# Patient Record
Sex: Female | Born: 1955 | Race: Black or African American | Hispanic: No | Marital: Married | Smoking: Former smoker
Health system: Southern US, Community
[De-identification: ages and names within clinical notes are randomized; demographics above are authoritative.]

## PROBLEM LIST (undated history)

## (undated) DIAGNOSIS — G43909 Migraine, unspecified, not intractable, without status migrainosus: Secondary | ICD-10-CM

## (undated) DIAGNOSIS — B019 Varicella without complication: Secondary | ICD-10-CM

## (undated) DIAGNOSIS — R519 Headache, unspecified: Secondary | ICD-10-CM

## (undated) DIAGNOSIS — R51 Headache: Secondary | ICD-10-CM

## (undated) DIAGNOSIS — N390 Urinary tract infection, site not specified: Secondary | ICD-10-CM

## (undated) HISTORY — DX: Varicella without complication: B01.9

## (undated) HISTORY — DX: Urinary tract infection, site not specified: N39.0

## (undated) HISTORY — DX: Migraine, unspecified, not intractable, without status migrainosus: G43.909

## (undated) HISTORY — DX: Headache, unspecified: R51.9

## (undated) HISTORY — PX: BREAST BIOPSY: SHX20

## (undated) HISTORY — DX: Headache: R51

---
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
## 2014-09-03 ENCOUNTER — Encounter (INDEPENDENT_AMBULATORY_CARE_PROVIDER_SITE_OTHER): Payer: Self-pay

## 2014-09-03 ENCOUNTER — Encounter: Payer: Self-pay | Admitting: Primary Care

## 2014-09-03 ENCOUNTER — Ambulatory Visit (INDEPENDENT_AMBULATORY_CARE_PROVIDER_SITE_OTHER): Payer: Managed Care, Other (non HMO) | Admitting: Primary Care

## 2014-09-03 VITALS — BP 122/86 | HR 71 | Temp 98.0°F | Ht 61.75 in | Wt 236.1 lb

## 2014-09-03 DIAGNOSIS — R079 Chest pain, unspecified: Secondary | ICD-10-CM

## 2014-09-03 NOTE — Progress Notes (Signed)
Pre visit review using our clinic review tool, if applicable. No additional management support is needed unless otherwise documented below in the visit note. 

## 2014-09-03 NOTE — Patient Instructions (Addendum)
Please schedule a physical with me in the next month. You will also schedule a lab only appointment one week prior. We will discuss your lab results during your physical.  Your ECG (heart test) did not show any abnormalities.   You may try taking ibuprofen 400-600 mg three times daily for chest wall pain. You should rest when possible to prevent any further muscle irritation.   It was a pleasure to meet you today! Please don't hesitate to call me with any questions. Welcome to [facility]!

## 2014-09-03 NOTE — Progress Notes (Signed)
   Subjective:    Patient ID: Sandra Ayala, female    DOB: 1955/05/18, 59 y.o.   MRN: [id number]  HPI  Sandra Ayala is a 59 year old female who presents today to establish care and discuss the problems mentioned below. Will obtain old records. She has not had a routine physical in over 20+.  1) Chest wall pain: Present to anterior chest and left side of breast for the past 2 months. Intermittent. She describes this pain as muscle tightness and her pain will move around. Chest will be tender upon palpation. 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   2) Frequent headaches/Migraines: Will get headaches twice weekly. She denies sensitivity to light and sound when getting severe headaches. Will take advil with relief.  Review of Systems  Constitutional: Negative for unexpected weight change.  HENT: Negative for rhinorrhea.   Respiratory: Negative for cough and shortness of breath.   Cardiovascular: Negative for chest pain.  Gastrointestinal: Negative for diarrhea and constipation.  Genitourinary: Negative for difficulty urinating.       No period in three years  Musculoskeletal: Negative for myalgias and arthralgias.  Skin: Negative for rash.  Neurological: Negative for dizziness and numbness.       Occasional headaches       Objective:   Physical Exam  Constitutional: She is oriented to person, place, and time. She appears well-nourished.  Cardiovascular: Normal rate and regular rhythm.     Chest wall tenderness with palpation.  Pulmonary/Chest: Effort normal and breath sounds normal.  Neurological: She is alert and oriented to person, place, and time.  Skin: Skin is warm and dry.  Psychiatric: She has a normal mood and affect.          Assessment & Plan:  Chest wall pain:  Pain upon palpation to anterior left chest during exam. Present for past 2 months. Denies nausea, diaphoresis, radiation. Describes pain as "tightnes". Denies belching, reflux of  gastric contents, epigastric pain. Suspect MSK related as her occupation requires lifting daily. ECG today reveals NSR with a rate of 66. No ST-elevation, PVC, PAC, t-wave inversion. Ibuprofen PRN, ice, rest. Follow up if no relief.

## 2014-09-04 ENCOUNTER — Other Ambulatory Visit: Payer: Self-pay | Admitting: Primary Care

## 2014-09-04 DIAGNOSIS — Z1231 Encounter for screening mammogram for malignant neoplasm of breast: Secondary | ICD-10-CM

## 2014-09-05 ENCOUNTER — Ambulatory Visit
Admission: RE | Admit: 2014-09-05 | Discharge: 2014-09-05 | Disposition: A | Discharge status: Home / Self Care | Payer: Managed Care, Other (non HMO) | Source: Ambulatory Visit | Attending: Primary Care | Admitting: Primary Care

## 2014-09-05 DIAGNOSIS — Z1231 Encounter for screening mammogram for malignant neoplasm of breast: Secondary | ICD-10-CM | POA: Diagnosis not present

## 2014-09-26 ENCOUNTER — Other Ambulatory Visit: Payer: Self-pay | Admitting: Primary Care

## 2014-09-26 DIAGNOSIS — Z0000 Encounter for general adult medical examination without abnormal findings: Secondary | ICD-10-CM

## 2014-10-02 ENCOUNTER — Other Ambulatory Visit (INDEPENDENT_AMBULATORY_CARE_PROVIDER_SITE_OTHER): Payer: Managed Care, Other (non HMO)

## 2014-10-02 DIAGNOSIS — Z0000 Encounter for general adult medical examination without abnormal findings: Secondary | ICD-10-CM | POA: Diagnosis not present

## 2014-10-02 LAB — LIPID PANEL
CHOLESTEROL: 251 mg/dL — AB (ref 0–200)
HDL: 69.9 mg/dL (ref >39.00)
LDL Cholesterol: 164 mg/dL — ABNORMAL HIGH (ref 0–99)
NONHDL: 181.26
Total CHOL/HDL Ratio: 4
Triglycerides: 88 mg/dL (ref 0.0–149.0)
VLDL: 17.6 mg/dL (ref 0.0–40.0)

## 2014-10-02 LAB — COMPREHENSIVE METABOLIC PANEL
ALT: 14 U/L (ref 0–35)
AST: 15 U/L (ref 0–37)
Albumin: 4.6 g/dL (ref 3.5–5.2)
Alkaline Phosphatase: 60 U/L (ref 39–117)
BILIRUBIN TOTAL: 0.7 mg/dL (ref 0.2–1.2)
BUN: 10 mg/dL (ref 6–23)
CALCIUM: 9.8 mg/dL (ref 8.4–10.5)
CHLORIDE: 100 meq/L (ref 96–112)
CO2: 29 meq/L (ref 19–32)
Creatinine, Ser: 0.65 mg/dL (ref 0.40–1.20)
GFR: 120.53 mL/min (ref >60.00)
GLUCOSE: 94 mg/dL (ref 70–99)
Potassium: 4.3 mEq/L (ref 3.5–5.1)
Sodium: 137 mEq/L (ref 135–145)
Total Protein: 7.4 g/dL (ref 6.0–8.3)

## 2014-10-02 LAB — TSH: TSH: 2.23 u[IU]/mL (ref 0.35–4.50)

## 2014-10-02 LAB — HEMOGLOBIN A1C: HEMOGLOBIN A1C: 5.7 % (ref 4.6–6.5)

## 2014-10-02 LAB — CBC
HEMATOCRIT: 37.8 % (ref 36.0–46.0)
Hemoglobin: 12.7 g/dL (ref 12.0–15.0)
MCHC: 33.7 g/dL (ref 30.0–36.0)
MCV: 89.8 fl (ref 78.0–100.0)
PLATELETS: 279 10*3/uL (ref 150.0–400.0)
RBC: 4.21 Mil/uL (ref 3.87–5.11)
RDW: 13.5 % (ref 11.5–15.5)
WBC: 4.6 10*3/uL (ref 4.0–10.5)

## 2014-10-02 LAB — VITAMIN D 25 HYDROXY (VIT D DEFICIENCY, FRACTURES): VITD: 14.52 ng/mL — AB (ref 30.00–100.00)

## 2014-10-09 ENCOUNTER — Ambulatory Visit (INDEPENDENT_AMBULATORY_CARE_PROVIDER_SITE_OTHER): Payer: Managed Care, Other (non HMO) | Admitting: Primary Care

## 2014-10-09 ENCOUNTER — Encounter: Payer: Self-pay | Admitting: Primary Care

## 2014-10-09 ENCOUNTER — Other Ambulatory Visit (HOSPITAL_COMMUNITY)
Admission: RE | Admit: 2014-10-09 | Discharge: 2014-10-09 | Disposition: A | Discharge status: Home / Self Care | Payer: Managed Care, Other (non HMO) | Source: Ambulatory Visit | Attending: Primary Care | Admitting: Primary Care

## 2014-10-09 VITALS — BP 120/86 | HR 68 | Temp 98.5°F | Ht 62.0 in | Wt 239.4 lb

## 2014-10-09 DIAGNOSIS — Z23 Encounter for immunization: Secondary | ICD-10-CM | POA: Diagnosis not present

## 2014-10-09 DIAGNOSIS — Z124 Encounter for screening for malignant neoplasm of cervix: Secondary | ICD-10-CM | POA: Diagnosis not present

## 2014-10-09 DIAGNOSIS — Z01419 Encounter for gynecological examination (general) (routine) without abnormal findings: Secondary | ICD-10-CM | POA: Diagnosis present

## 2014-10-09 DIAGNOSIS — E559 Vitamin D deficiency, unspecified: Secondary | ICD-10-CM | POA: Diagnosis not present

## 2014-10-09 DIAGNOSIS — Z1151 Encounter for screening for human papillomavirus (HPV): Secondary | ICD-10-CM | POA: Insufficient documentation

## 2014-10-09 DIAGNOSIS — Z30432 Encounter for removal of intrauterine contraceptive device: Secondary | ICD-10-CM

## 2014-10-09 DIAGNOSIS — Z0000 Encounter for general adult medical examination without abnormal findings: Secondary | ICD-10-CM | POA: Insufficient documentation

## 2014-10-09 DIAGNOSIS — E785 Hyperlipidemia, unspecified: Secondary | ICD-10-CM | POA: Insufficient documentation

## 2014-10-09 DIAGNOSIS — Z1211 Encounter for screening for malignant neoplasm of colon: Secondary | ICD-10-CM

## 2014-10-09 MED ORDER — VITAMIN D (ERGOCALCIFEROL) 1.25 MG (50000 UNIT) PO CAPS: ORAL_CAPSULE | ORAL | Status: DC

## 2014-10-09 NOTE — Assessment & Plan Note (Signed)
Tdap and flu administered today. Pap preformed today, referral for colonoscopy made. Recommended annual eye and dental exams. Discussed the importance of healthy diet and exercise. She is to increase water consumption. Labs with hyperlipidemia and low vitamin D levels. Exam unremarkable. Follow up in 1 year for repeat physical.

## 2014-10-09 NOTE — Patient Instructions (Signed)
You will be contacted regarding your referral to GI for your colonoscopy.  Please let us know if you have not heard back within one week.   Start Vitamin D capsules. Take 1 capsule by mouth once weekly for 12 weeks.  Start drinking more water. You need 2 liters of water daily.  Start exercising. You need 1 hour of moderate intensity exercise 5 days a week.  Work to increase consumption of vegetables and lean meats.  Schedule a lab only appointment in 3 months for re-evaluation of cholesterol and vitamin D levels.  Follow up in 1 year for repeat physical.  It was a pleasure to see you today!

## 2014-10-09 NOTE — Progress Notes (Signed)
Pre visit review using our clinic review tool, if applicable. No additional management support is needed unless otherwise documented below in the visit note. 

## 2014-10-09 NOTE — Assessment & Plan Note (Signed)
Vitamin D level of 18 on labs. Start vitamin D 50,000 units once weekly for 12 weeks. Recheck in 12 weeks.

## 2014-10-09 NOTE — Progress Notes (Signed)
Subjective:    Patient ID: Sandra Ayala, female    DOB: Nov 14, 1955, 59 y.o.   MRN: [id number]  HPI  Ms. Sandra Ayala is a 59 year old female who presents today for complete physical.  Immunizations: -Tetanus: Unsure, believes it's been over 10 years. -Influenza: Has not completed. Will provide today.  Diet: Endorses poor diet. Her diet consists of: Breakfast: Protein bar and apple. Lunch: Orange and salad Dinner: Skips, salad, casseroles, mashed potatoes. Desserts: Ice cream. Twice weekly. Snack: Chips Beverages: Water (1 glass of water daily), coffee.  Exercise: She does not exercise. Eye exam: Completed years ago. Denies changes in vision. Dental exam: Completed 4 years ago. Colonoscopy: Has never completed. Will set up today. Pap Smear: Completed 7 years ago. Will preform today. Mammogram: Completed in August 2016   Review of Systems  HENT: Negative for rhinorrhea.   Respiratory: Negative for cough and shortness of breath.   Cardiovascular: Negative for chest pain.  Gastrointestinal: Negative for diarrhea and constipation.  Genitourinary: Negative for difficulty urinating.  Musculoskeletal: Negative for myalgias and arthralgias.  Skin: Negative for rash.  Neurological: Negative for dizziness, numbness and headaches.  Psychiatric/Behavioral:       Denies concerns for anxiety or depression       Past Medical History  Diagnosis Date  . Chicken pox   . Frequent headaches   . Migraine   . UTI (lower urinary tract infection)     Social History   Social History  . Marital Status: Married    Spouse Name: N/A  . Number of Children: N/A  . Years of Education: N/A   Occupational History  . Not on file.   Social History Main Topics  . Smoking status: Never Smoker   . Smokeless tobacco: Not on file  . Alcohol Use: 0.0 oz/week    0 Standard drinks or equivalent per week     Comment: socially  . Drug Use: Not on file  . Sexual Activity: Not on file   Other  Topics Concern  . Not on file   Social History Narrative   Married.   4 children.   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   Enjoys reading.       Past Surgical History  Procedure Laterality Date  . Breast surgery  1973  . Breast biopsy      pt states a small lump was removed when she was 59 years old, unsure of which side    Family History  Problem Relation Age of Onset  . Cancer Mother 3    breast  . Breast cancer Mother 74  . Cancer Sister 43    breast, deceased  . Parkinson's disease Father     deceased    No Known Allergies  No current outpatient prescriptions on file prior to visit.   No current facility-administered medications on file prior to visit.    BP 120/86 mmHg  Pulse 68  Temp(Src) 98.5 F (36.9 C) (Oral)  Ht 5\' 2"  (1.575 m)  Wt 239 lb 6.4 oz (108.591 kg)  BMI 43.78 kg/m2  SpO2 98%    Objective:   Physical Exam  Constitutional: She is oriented to person, place, and time. She appears well-nourished.  HENT:  Right Ear: Tympanic membrane and ear canal normal.  Left Ear: Tympanic membrane and ear canal normal.  Nose: Nose normal.  Mouth/Throat: Oropharynx is clear and moist.  Eyes: Conjunctivae and EOM are normal. Pupils are equal, round, and reactive to light.  Neck: Neck supple. No thyromegaly present.  Cardiovascular: Normal rate and regular rhythm.   Pulmonary/Chest: Effort normal and breath sounds normal.  Abdominal: Soft. Bowel sounds are normal. There is no tenderness.  Genitourinary: Vagina normal. Cervix exhibits no motion tenderness and no discharge. Right adnexum displays no tenderness. Left adnexum displays no tenderness.  Musculoskeletal: Normal range of motion.  Lymphadenopathy:    She has no cervical adenopathy.  Neurological: She is alert and oriented to person, place, and time. She has normal reflexes. No cranial nerve deficit.  Skin: Skin is warm and dry.  Psychiatric: She has a normal mood and affect.            Assessment & Plan:

## 2014-10-09 NOTE — Assessment & Plan Note (Signed)
TC and LDL above goal. Discussed importance of healthy diet and exercise. She will work hard to improve her lifestyle before introduction of medication. Recheck lipids and vitamin D in 3 months.

## 2014-10-10 LAB — CYTOLOGY - PAP

## 2014-10-14 ENCOUNTER — Encounter: Payer: Self-pay | Admitting: *Deleted

## 2014-10-16 ENCOUNTER — Telehealth: Payer: Self-pay | Admitting: Primary Care

## 2014-10-16 NOTE — Telephone Encounter (Signed)
Pt returned your call, please call back at [phone] thanks

## 2014-10-17 NOTE — Telephone Encounter (Signed)
Did not contact patient. Patient has referral to GYN.

## 2014-11-04 ENCOUNTER — Encounter: Payer: Self-pay | Admitting: Obstetrics & Gynecology

## 2014-11-04 ENCOUNTER — Ambulatory Visit (INDEPENDENT_AMBULATORY_CARE_PROVIDER_SITE_OTHER): Payer: Managed Care, Other (non HMO) | Admitting: Obstetrics & Gynecology

## 2014-11-04 VITALS — BP 133/65 | HR 93 | Resp 20 | Ht 62.0 in | Wt 238.0 lb

## 2014-11-04 DIAGNOSIS — Z538 Procedure and treatment not carried out for other reasons: Secondary | ICD-10-CM

## 2014-11-04 DIAGNOSIS — Z30432 Encounter for removal of intrauterine contraceptive device: Secondary | ICD-10-CM

## 2014-11-04 DIAGNOSIS — T8389XA Other specified complication of genitourinary prosthetic devices, implants and grafts, initial encounter: Secondary | ICD-10-CM

## 2014-11-04 DIAGNOSIS — Z975 Presence of (intrauterine) contraceptive device: Principal | ICD-10-CM

## 2014-11-04 NOTE — Progress Notes (Signed)
    [place] PROCEDURE NOTE  Sandra Ayala is a 59 y.o. G4P0 here for IUD removal.  Referred from PCP.  Had IUD placed seven years ago from contraception, unsure of what type.  No GYN concerns.  Last pap smear was on 10/09/2014 and was normal with negative HRHPV.  Normal mammogram on 09/05/2014.  IUD Removal Attempt Patient identified, informed consent performed, consent signed.  Patient was in the dorsal lithotomy position, normal external genitalia was noted.  A speculum was placed in the patient's vagina, normal discharge was noted, no lesions. The cervix was visualized, no lesions, no abnormal discharge.  The strings of the IUD were not visualized, so betadine was used to swab there cervix, and Kelly forceps were introduced into the cervical canal. Unable to grasp any strings or palpate IUD so procedure was aborted.  There was minimal bleeding.  Patient tolerated the procedure attempt well.    Bedside clinic scan did not reveal any IUD in the uterus but this was a limited scan.  Formal pelvic ultrasound was ordered for further evaluation; will follow up results and manage accordingly.  Routine preventative health maintenance measures emphasized.   Verita Schneiders, MD, [place] Attending Obstetrician & Gynecologist, [place] for [facility]

## 2014-11-04 NOTE — Patient Instructions (Signed)
Return to clinic for any scheduled appointments or for any gynecologic concerns as needed.   

## 2014-11-07 ENCOUNTER — Ambulatory Visit
Admission: RE | Admit: 2014-11-07 | Discharge: 2014-11-07 | Disposition: A | Discharge status: Home / Self Care | Payer: Managed Care, Other (non HMO) | Source: Ambulatory Visit | Attending: Obstetrics & Gynecology | Admitting: Obstetrics & Gynecology

## 2014-11-07 DIAGNOSIS — Z30431 Encounter for routine checking of intrauterine contraceptive device: Secondary | ICD-10-CM | POA: Diagnosis not present

## 2014-11-07 DIAGNOSIS — Z975 Presence of (intrauterine) contraceptive device: Secondary | ICD-10-CM

## 2014-11-07 DIAGNOSIS — Z538 Procedure and treatment not carried out for other reasons: Secondary | ICD-10-CM

## 2014-11-12 ENCOUNTER — Telehealth: Payer: Self-pay | Admitting: Primary Care

## 2014-11-12 DIAGNOSIS — E559 Vitamin D deficiency, unspecified: Secondary | ICD-10-CM

## 2014-11-12 NOTE — Telephone Encounter (Signed)
Received fax for [facility] for vitamin D 50,000 units. I prescribed this in late September.  1. Has she picked up a script from [place]? 2. Has she started taking it? 3. Can she not continue to get the refills at [facility]? Is it a cheaper/better option through [facility]?  Thanks.

## 2014-11-13 ENCOUNTER — Telehealth: Payer: Self-pay | Admitting: Primary Care

## 2014-11-13 NOTE — Telephone Encounter (Signed)
Pt states that she received a call - please call back at [phone] Thank you

## 2014-11-13 NOTE — Telephone Encounter (Signed)
Called and notified patient of Kate's comments.

## 2014-11-13 NOTE — Telephone Encounter (Signed)
Called and notified patient of Kate's comments.  She has pick up the script at [facility] and just start taking it. Patient stated she did not have a chose, her insurance stated she have to get it thru mail order.

## 2014-11-17 MED ORDER — VITAMIN D (ERGOCALCIFEROL) 1.25 MG (50000 UNIT) PO CAPS: ORAL_CAPSULE | ORAL | Status: DC

## 2014-11-17 NOTE — Telephone Encounter (Signed)
Rx has been sent to [facility]

## 2014-11-17 NOTE — Addendum Note (Signed)
Addended by: Jacqualin Combes on: 11/17/2014 03:58 PM   Modules accepted: Orders

## 2014-11-18 ENCOUNTER — Ambulatory Visit (INDEPENDENT_AMBULATORY_CARE_PROVIDER_SITE_OTHER): Payer: Managed Care, Other (non HMO) | Admitting: Obstetrics & Gynecology

## 2014-11-18 ENCOUNTER — Encounter: Payer: Self-pay | Admitting: Obstetrics & Gynecology

## 2014-11-18 VITALS — BP 141/90 | HR 71 | Wt 239.0 lb

## 2014-11-18 DIAGNOSIS — Z30432 Encounter for removal of intrauterine contraceptive device: Secondary | ICD-10-CM

## 2014-11-18 DIAGNOSIS — Z975 Presence of (intrauterine) contraceptive device: Secondary | ICD-10-CM | POA: Insufficient documentation

## 2014-11-18 DIAGNOSIS — Z538 Procedure and treatment not carried out for other reasons: Secondary | ICD-10-CM

## 2014-11-18 NOTE — Progress Notes (Signed)
    [place] PROCEDURE NOTE  Sandra Ayala is a 59 y.o. G4P0 here for second IUD removal attempt. Had IUD placed over seven years ago from contraception, unsure of what type.  Had formal ultrasound after initial attempt that showed IUD in place.  11/07/2014  TRANSABDOMINAL AND TRANSVAGINAL ULTRASOUND OF PELVIS  CLINICAL DATA:  59 year old postmenopausal female with a history of IUD placement 7 years prior, status post unsuccessful attempted IUD removal due to inability to palpate IUD string. TECHNIQUE: Both transabdominal and transvaginal ultrasound examinations of the pelvis were performed. Transabdominal technique was performed for global imaging of the pelvis including uterus, ovaries, adnexal regions, and pelvic cul-de-sac. It was necessary to proceed with endovaginal exam following the transabdominal exam to visualize the endometrium and IUD. COMPARISON:  None FINDINGS: Uterus Measurements: 10.8 x 3.8 x 4.7 cm. The uterus is anteverted and anteflexed and mildly globular in confirmation. There is diffuse myometrial heterogeneity with refractory myometrial shadowing, suggesting uterine adenomyosis. There is a solitary right anterior fundal subserosal 2.2 x 2.2 x 2.0 cm fibroid. Normal Cesarean scar is seen in the anterior lower uterine segment. Endometrium Thickness: 3 mm. The intrauterine device appears well positioned within the fundal and of the endometrial cavity, with no evidence of myometrial penetration by the side arms of the device. Endometrial visualization is limited by acoustic shadowing from the IUD. The bilayer endometrial thickness is within normal postmenopausal limits. No focal endometrial lesion or endometrial cavity fluid are detected. Right ovary Measurements: 1.6 x 2.2 x 1.7 cm. There is a solitary benign-appearing simple 0.7 cm right ovarian cyst. No suspicious right ovarian or right adnexal masses. Left ovary Measurements: 3.1 x 1.3 x 3.4 cm. The left ovary is seen only on  the transabdominal portion of the scan and appears normal. No left ovarian or left adnexal masses. Other findings No abnormal free fluid in the pelvis. IMPRESSION: 1. IUD is well positioned within the endometrial cavity, with no IUD complication detected. 2. Limited endometrial visualization, with no endometrial abnormality detected. 3. Solitary subserosal uterine fibroid. Possible superimposed uterine adenomyosis. 4. No suspicious ovarian or adnexal masses. Electronically Signed   By: Ilona Sorrel M.D.   On: 11/07/2014 15:56    No GYN concerns. Last pap smear was on 10/09/2014 and was normal with negative HRHPV. Normal mammogram on 09/05/2014.  IUD Removal Attempt Patient identified, informed consent performed, consent signed. Patient was in the dorsal lithotomy position, normal external genitalia was noted. A speculum was placed in the patient's vagina, normal discharge was noted, no lesions. The cervix was visualized, no lesions, no abnormal discharge. Betadine was used to swab there cervix, and Kelly forceps were introduced into the endometrial cavity. Unable to grasp any strings or palpate IUD after multiple attempts with Kelly forceps and IUD hook, so procedure was aborted. There was minimal bleeding. Patient tolerated the procedure attempt well.   Patient was given option of proceeding with removal in the OR if desired.  She desires in office procedure due to cost, will explore other practices that have in office hysteroscopy and sedation resources.    Routine preventative health maintenance measures emphasized.   Verita Schneiders, MD, [place] Attending Obstetrician & Gynecologist, [place] for [facility]

## 2014-11-18 NOTE — Patient Instructions (Signed)
Return to clinic for any scheduled appointments or for any gynecologic concerns as needed.   

## 2014-11-19 ENCOUNTER — Encounter: Payer: Self-pay | Admitting: *Deleted

## 2014-11-24 ENCOUNTER — Telehealth: Payer: Self-pay | Admitting: Primary Care

## 2014-11-24 DIAGNOSIS — Z0279 Encounter for issue of other medical certificate: Secondary | ICD-10-CM

## 2014-11-24 NOTE — Telephone Encounter (Signed)
Noted. Completed and placed in Chan's Inbox.

## 2014-11-24 NOTE — Telephone Encounter (Signed)
Place in Kate's inbox. 

## 2014-11-24 NOTE — Telephone Encounter (Signed)
Pt dropped off wellness form to be completed. Form in [employer]. Pt is requesting form be mailed back to her or she would come and pick up.  CB # [phone]  Thank you

## 2014-11-25 NOTE — Telephone Encounter (Signed)
Message left for patient to return my call.  

## 2014-12-12 ENCOUNTER — Ambulatory Visit
Admission: RE | Admit: 2014-12-12 | Discharge: 2014-12-12 | Disposition: A | Discharge status: Home / Self Care | Payer: Managed Care, Other (non HMO) | Source: Ambulatory Visit | Attending: Unknown Physician Specialty | Admitting: Unknown Physician Specialty

## 2014-12-12 ENCOUNTER — Encounter
Admission: RE | Disposition: A | Discharge status: Home / Self Care | Payer: Self-pay | Source: Ambulatory Visit | Attending: Unknown Physician Specialty

## 2014-12-12 ENCOUNTER — Ambulatory Visit: Payer: Managed Care, Other (non HMO) | Admitting: Anesthesiology

## 2014-12-12 ENCOUNTER — Encounter: Payer: Self-pay | Admitting: *Deleted

## 2014-12-12 DIAGNOSIS — Z1211 Encounter for screening for malignant neoplasm of colon: Secondary | ICD-10-CM | POA: Insufficient documentation

## 2014-12-12 DIAGNOSIS — Z87891 Personal history of nicotine dependence: Secondary | ICD-10-CM | POA: Diagnosis not present

## 2014-12-12 DIAGNOSIS — G43909 Migraine, unspecified, not intractable, without status migrainosus: Secondary | ICD-10-CM | POA: Insufficient documentation

## 2014-12-12 DIAGNOSIS — Z6841 Body Mass Index (BMI) 40.0 and over, adult: Secondary | ICD-10-CM | POA: Insufficient documentation

## 2014-12-12 DIAGNOSIS — K5730 Diverticulosis of large intestine without perforation or abscess without bleeding: Secondary | ICD-10-CM | POA: Insufficient documentation

## 2014-12-12 DIAGNOSIS — D123 Benign neoplasm of transverse colon: Secondary | ICD-10-CM | POA: Diagnosis not present

## 2014-12-12 DIAGNOSIS — K640 First degree hemorrhoids: Secondary | ICD-10-CM | POA: Diagnosis not present

## 2014-12-12 HISTORY — PX: COLONOSCOPY WITH PROPOFOL: SHX5780

## 2014-12-12 LAB — HM COLONOSCOPY

## 2014-12-12 SURGERY — COLONOSCOPY WITH PROPOFOL
Anesthesia: General

## 2014-12-12 MED ORDER — SODIUM CHLORIDE 0.9 % IV SOLN: INTRAVENOUS | Status: DC

## 2014-12-12 MED ORDER — SODIUM CHLORIDE 0.9 % IV SOLN
INTRAVENOUS | Status: DC
  Administered 2014-12-12: 13:00:00 via INTRAVENOUS

## 2014-12-12 MED ORDER — LIDOCAINE HCL (PF) 2 % IJ SOLN
INTRAMUSCULAR | Status: DC | PRN
  Administered 2014-12-12: 50 mg

## 2014-12-12 MED ORDER — FENTANYL CITRATE (PF) 100 MCG/2ML IJ SOLN
INTRAMUSCULAR | Status: DC | PRN
  Administered 2014-12-12: 25 ug via INTRAVENOUS
  Administered 2014-12-12: 50 ug via INTRAVENOUS
  Administered 2014-12-12: 25 ug via INTRAVENOUS

## 2014-12-12 MED ORDER — PROPOFOL 10 MG/ML IV BOLUS
INTRAVENOUS | Status: DC | PRN
  Administered 2014-12-12: 30 mg via INTRAVENOUS

## 2014-12-12 MED ORDER — PROPOFOL 500 MG/50ML IV EMUL
INTRAVENOUS | Status: DC | PRN
  Administered 2014-12-12: 140 ug/kg/min via INTRAVENOUS

## 2014-12-12 MED ORDER — MIDAZOLAM HCL 5 MG/5ML IJ SOLN
INTRAMUSCULAR | Status: DC | PRN
  Administered 2014-12-12: 1 mg via INTRAVENOUS

## 2014-12-12 NOTE — Transfer of Care (Signed)
Immediate Anesthesia Transfer of Care Note  Patient: Sandra Ayala  Procedure(s) Performed: Procedure(s): COLONOSCOPY WITH PROPOFOL (N/A)  Patient Location: PACU  Anesthesia Type:General  Level of Consciousness: sedated  Airway & Oxygen Therapy: Patient Spontanous Breathing and Patient connected to nasal cannula oxygen  Post-op Assessment: Report given to RN and Post -op Vital signs reviewed and stable  Post vital signs: Reviewed and stable  Last Vitals:  Filed Vitals:   12/12/14 1232  BP: 161/90  Pulse: 99  Temp: 36.4 C  Resp: 17    Complications: No apparent anesthesia complications

## 2014-12-12 NOTE — H&P (Signed)
   Primary Care Physician:  Sheral Flow, NP Primary Gastroenterologist:  Dr. Vira Agar  Pre-Procedure History & Physical: HPI:  Sandra Ayala is a 59 y.o. female is here for an colonoscopy.   Past Medical History  Diagnosis Date  . Chicken pox   . Frequent headaches   . Migraine   . UTI (lower urinary tract infection)     Past Surgical History  Procedure Laterality Date  . Breast surgery  1973  . Breast biopsy      pt states a small lump was removed when she was 59 years old, unsure of which side    Prior to Admission medications   Medication Sig Start Date End Date Taking? Authorizing Provider  Vitamin D, Ergocalciferol, (DRISDOL) 50000 UNITS CAPS capsule Take 1 capsule by mouth once weekly for a total of 12 weeks 11/17/14   Pleas Koch, NP    Allergies as of 11/11/2014  . (No Known Allergies)    Family History  Problem Relation Age of Onset  . Cancer Mother 9    breast  . Breast cancer Mother 60  . Cancer Sister 34    breast, deceased  . Parkinson's disease Father     deceased    Social History   Social History  . Marital Status: Married    Spouse Name: N/A  . Number of Children: N/A  . Years of Education: N/A   Occupational History  . Not on file.   Social History Main Topics  . Smoking status: Former Research scientist (life sciences)  . Smokeless tobacco: Not on file  . Alcohol Use: 0.6 oz/week    0 Standard drinks or equivalent, 1 Glasses of wine per week     Comment: socially  . Drug Use: Not on file  . Sexual Activity: Yes    Birth Control/ Protection: IUD   Other Topics Concern  . Not on file   Social History Narrative   Married.   4 children.   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   Enjoys reading.       Review of Systems: See HPI, otherwise negative ROS  Physical Exam: BP 161/90 mmHg  Pulse 99  Temp(Src) 97.6 F (36.4 C) (Tympanic)  Resp 17  Ht 5\' 2"  (1.575 m)  Wt 100.699 kg (222 lb)  BMI 40.59 kg/m2  SpO2 99% General:   Alert,   pleasant and cooperative in NAD Head:  Normocephalic and atraumatic. Neck:  Supple; no masses or thyromegaly. Lungs:  Clear throughout to auscultation.    Heart:  Regular rate and rhythm. Abdomen:  Soft, nontender and nondistended. Normal bowel sounds, without guarding, and without rebound.   Neurologic:  Alert and  oriented x4;  grossly normal neurologically.  Impression/Plan: Sandra Ayala is here for an colonoscopy to be performed for screening  Risks, benefits, limitations, and alternatives regarding  colonoscopy have been reviewed with the patient.  Questions have been answered.  All parties agreeable.   Gaylyn Cheers, MD  12/12/2014, 1:08 PM

## 2014-12-12 NOTE — Op Note (Signed)
[facility] Patient Name: Sandra Ayala Procedure Date: 12/12/2014 1:02 PM MRN: [id number] Account #: [id number] Date of Birth: [birth year] Admit Type: Outpatient Age: 59 Room: [facility] ENDO ROOM 1 Gender: Female Note Status: Finalized Procedure:         Colonoscopy Indications:       Screening for colorectal malignant neoplasm Providers:         Manya Silvas, MD Medicines:         Propofol per Anesthesia Complications:     No immediate complications. Procedure:         Pre-Anesthesia Assessment:                    - After reviewing the risks and benefits, the patient was                     deemed in satisfactory condition to undergo the procedure.                    After obtaining informed consent, the colonoscope was                     passed under direct vision. Throughout the procedure, the                     patient's blood pressure, pulse, and oxygen saturations                     were monitored continuously. The Colonoscope was                     introduced through the anus and advanced to the the cecum,                     identified by appendiceal orifice and ileocecal valve. The                     colonoscopy was performed without difficulty. The patient                     tolerated the procedure well. The quality of the bowel                     preparation was good. Findings:      A small polyp was found in the transverse colon. The polyp was sessile.       The polyp was removed with a cold snare. Resection and retrieval were       complete.      Multiple small and large-mouthed diverticula were found in the sigmoid       colon, in the descending colon and in the transverse colon.      Internal hemorrhoids were found during endoscopy. The hemorrhoids were       small and Grade I (internal hemorrhoids that do not prolapse).      The exam was otherwise without abnormality. Impression:        - One small polyp in the  transverse colon. Resected and                     retrieved.                    - Diverticulosis in the sigmoid colon, in the descending  colon and in the transverse colon.                    - Internal hemorrhoids.                    - The examination was otherwise normal. Recommendation:    - Await pathology results. Manya Silvas, MD 12/12/2014 1:42:39 PM This report has been signed electronically. Number of Addenda: 0 Note Initiated On: 12/12/2014 1:02 PM Scope Withdrawal Time: 0 hours 14 minutes 28 seconds  Total Procedure Duration: 0 hours 24 minutes 3 seconds       [facility]

## 2014-12-12 NOTE — Anesthesia Preprocedure Evaluation (Signed)
Anesthesia Evaluation  Patient identified by MRN, date of birth, ID band Patient awake    Reviewed: Allergy & Precautions, H&P , NPO status , Patient's Chart, lab work & pertinent test results  History of Anesthesia Complications Negative for: history of anesthetic complications  Airway Mallampati: III  TM Distance: >3 FB Neck ROM: full    Dental  (+) Poor Dentition, Chipped   Pulmonary neg pulmonary ROS, neg shortness of breath, former smoker,    Pulmonary exam normal breath sounds clear to auscultation       Cardiovascular Exercise Tolerance: Good (-) angina(-) Past MI and (-) DOE negative cardio ROS Normal cardiovascular exam Rhythm:regular Rate:Normal     Neuro/Psych  Headaches, negative psych ROS   GI/Hepatic negative GI ROS, Neg liver ROS, neg GERD  ,  Endo/Other  Morbid obesity  Renal/GU negative Renal ROS  negative genitourinary   Musculoskeletal   Abdominal   Peds  Hematology negative hematology ROS (+)   Anesthesia Other Findings Past Medical History:   Chicken pox                                                  Frequent headaches                                           Migraine                                                     UTI (lower urinary tract infection)                         Past Surgical History:   BREAST SURGERY                                   1973         BREAST BIOPSY                                                   Comment:pt states a small lump was removed when she was              59 years old, unsure of which side  BMI    Body Mass Index   40.59 kg/m 2    Signs and symptoms suggestive of sleep apnea    Reproductive/Obstetrics negative OB ROS                             Anesthesia Physical Anesthesia Plan  ASA: III  Anesthesia Plan: General   Post-op Pain Management:    Induction:   Airway Management Planned:   Additional  Equipment:   Intra-op Plan:   Post-operative Plan:   Informed Consent: I have reviewed the patients History and Physical, chart, labs and discussed the procedure including the  risks, benefits and alternatives for the proposed anesthesia with the patient or authorized representative who has indicated his/her understanding and acceptance.   Dental Advisory Given  Plan Discussed with: Anesthesiologist, CRNA and Surgeon  Anesthesia Plan Comments:         Anesthesia Quick Evaluation

## 2014-12-13 NOTE — Anesthesia Postprocedure Evaluation (Signed)
  Anesthesia Post-op Note  Patient: Sandra Ayala  Procedure(s) Performed: Procedure(s): COLONOSCOPY WITH PROPOFOL (N/A)  Anesthesia type:General  Patient location: PACU  Post pain: Pain level controlled  Post assessment: Post-op Vital signs reviewed, Patient's Cardiovascular Status Stable, Respiratory Function Stable, Patent Airway and No signs of Nausea or vomiting  Post vital signs: Reviewed and stable  Last Vitals:  Filed Vitals:   12/12/14 1411  BP: 146/77  Pulse: 73  Temp:   Resp: 14    Level of consciousness: awake, alert  and patient cooperative  Complications: No apparent anesthesia complications

## 2014-12-16 LAB — SURGICAL PATHOLOGY

## 2015-01-01 ENCOUNTER — Other Ambulatory Visit: Payer: Self-pay | Admitting: Primary Care

## 2015-01-01 DIAGNOSIS — E785 Hyperlipidemia, unspecified: Secondary | ICD-10-CM

## 2015-01-01 DIAGNOSIS — E559 Vitamin D deficiency, unspecified: Secondary | ICD-10-CM

## 2015-01-09 ENCOUNTER — Other Ambulatory Visit: Payer: Managed Care, Other (non HMO)

## 2015-09-30 ENCOUNTER — Other Ambulatory Visit: Payer: Self-pay | Admitting: Internal Medicine

## 2015-09-30 DIAGNOSIS — Z0000 Encounter for general adult medical examination without abnormal findings: Secondary | ICD-10-CM

## 2015-10-05 ENCOUNTER — Other Ambulatory Visit (INDEPENDENT_AMBULATORY_CARE_PROVIDER_SITE_OTHER): Payer: Managed Care, Other (non HMO)

## 2015-10-05 DIAGNOSIS — Z0000 Encounter for general adult medical examination without abnormal findings: Secondary | ICD-10-CM | POA: Diagnosis not present

## 2015-10-05 LAB — CBC
HCT: 36.7 % (ref 36.0–46.0)
Hemoglobin: 12.4 g/dL (ref 12.0–15.0)
MCHC: 33.7 g/dL (ref 30.0–36.0)
MCV: 88.9 fl (ref 78.0–100.0)
Platelets: 286 10*3/uL (ref 150.0–400.0)
RBC: 4.13 Mil/uL (ref 3.87–5.11)
RDW: 13.7 % (ref 11.5–15.5)
WBC: 5.5 10*3/uL (ref 4.0–10.5)

## 2015-10-05 LAB — COMPREHENSIVE METABOLIC PANEL
ALBUMIN: 4.3 g/dL (ref 3.5–5.2)
ALT: 15 U/L (ref 0–35)
AST: 15 U/L (ref 0–37)
Alkaline Phosphatase: 57 U/L (ref 39–117)
BUN: 14 mg/dL (ref 6–23)
CHLORIDE: 100 meq/L (ref 96–112)
CO2: 28 mEq/L (ref 19–32)
CREATININE: 0.69 mg/dL (ref 0.40–1.20)
Calcium: 9.4 mg/dL (ref 8.4–10.5)
GFR: 112.11 mL/min (ref >60.00)
Glucose, Bld: 105 mg/dL — ABNORMAL HIGH (ref 70–99)
Potassium: 4 mEq/L (ref 3.5–5.1)
SODIUM: 139 meq/L (ref 135–145)
TOTAL PROTEIN: 7.5 g/dL (ref 6.0–8.3)
Total Bilirubin: 0.6 mg/dL (ref 0.2–1.2)

## 2015-10-05 LAB — LIPID PANEL
CHOLESTEROL: 228 mg/dL — AB (ref 0–200)
HDL: 61.2 mg/dL (ref >39.00)
LDL CALC: 138 mg/dL — AB (ref 0–99)
NONHDL: 166.66
Total CHOL/HDL Ratio: 4
Triglycerides: 143 mg/dL (ref 0.0–149.0)
VLDL: 28.6 mg/dL (ref 0.0–40.0)

## 2015-10-05 LAB — VITAMIN D 25 HYDROXY (VIT D DEFICIENCY, FRACTURES): VITD: 14.96 ng/mL — AB (ref 30.00–100.00)

## 2015-10-05 LAB — HEMOGLOBIN A1C: HEMOGLOBIN A1C: 5.7 % (ref 4.6–6.5)

## 2015-10-12 ENCOUNTER — Encounter: Payer: Self-pay | Admitting: Primary Care

## 2015-10-12 ENCOUNTER — Encounter: Payer: Managed Care, Other (non HMO) | Admitting: Primary Care

## 2015-10-12 ENCOUNTER — Ambulatory Visit (INDEPENDENT_AMBULATORY_CARE_PROVIDER_SITE_OTHER): Payer: Managed Care, Other (non HMO) | Admitting: Primary Care

## 2015-10-12 VITALS — BP 138/92 | HR 71 | Temp 98.1°F | Ht 62.0 in | Wt 245.8 lb

## 2015-10-12 DIAGNOSIS — E559 Vitamin D deficiency, unspecified: Secondary | ICD-10-CM

## 2015-10-12 DIAGNOSIS — Z0000 Encounter for general adult medical examination without abnormal findings: Secondary | ICD-10-CM | POA: Diagnosis not present

## 2015-10-12 DIAGNOSIS — Z23 Encounter for immunization: Secondary | ICD-10-CM | POA: Diagnosis not present

## 2015-10-12 DIAGNOSIS — E785 Hyperlipidemia, unspecified: Secondary | ICD-10-CM | POA: Diagnosis not present

## 2015-10-12 NOTE — Assessment & Plan Note (Signed)
Improved, continue to work on diet and exercise. Repeat in 1 year.

## 2015-10-12 NOTE — Patient Instructions (Signed)
Your vitamin D level is too low. Start Vitmain D capsules. Take 2000 units once daily for 3 months, then decrease to 1000 units once daily.  Schedule a lab only appointment in 3 months to recheck these levels.  Your cholesterol level is slightly too high, but improved from last year.  It is important that you improve your diet. Please limit processed carbohydrates in the form of white bread, rice, pasta, cakes, cookies, sugary drinks, etc. Increase your consumption of fresh fruits and vegetables, whole grains, lean protein.  Ensure you are consuming 64 ounces of water daily.  Start exercising. You should be getting 150 minutes of moderate intensity exercise weekly.  Follow up in 1 year for repeat physical or sooner if needed.  It was a pleasure to see you today!

## 2015-10-12 NOTE — Progress Notes (Signed)
Pre visit review using our clinic review tool, if applicable. No additional management support is needed unless otherwise documented below in the visit note. 

## 2015-10-12 NOTE — Addendum Note (Signed)
Addended by: Jacqualin Combes on: 10/12/2015 03:00 PM   Modules accepted: Orders

## 2015-10-12 NOTE — Assessment & Plan Note (Signed)
Level of 14 on recent labs. Start vitamin d 2000 units once daily x 3 months. Recheck labs in 3 months.

## 2015-10-12 NOTE — Assessment & Plan Note (Signed)
Tetanus UTD, flu provided today. Pap and mammogram UTD. Colonoscopy due in 2021. Poor diet, does not exercise. Discussed the importance of a healthy diet and regular exercise in order for weight loss and to reduce risk of other medical diseases. Exam unremarkable. Labs with improvement in lipids, low vitamin D. Follow up in 1 year for repeat physical.

## 2015-10-12 NOTE — Progress Notes (Signed)
Subjective:    Patient ID: Sandra Ayala, female    DOB: October 02, 1955, 60 y.o.   MRN: [id number]  HPI  Sandra Ayala is a 60 year old female who presents today for complete physical.  Immunizations: -Tetanus: Completed in September 2016 -Influenza: Completed last season, due.  Diet: She endorses a poor diet. Breakfast: Skips, eggs, muffin, bacon, grits Lunch: Salad Dinner: Skips, potato, pasta, meat Snacks: Occasionally, snack cakes, chips Desserts: 3 times weekly Beverages: Little water, green tea, coffee  Exercise: She does not exercise routinely Eye exam: Completed several years ago, denies significant changes. Dental exam: Has not completed recently.  Colonoscopy: Completed in 2016. Due again in 2021 Pap Smear: Completed in September 2016, normal. Mammogram: Completed in August 2016, normal. Will repeat next year.   Review of Systems  Constitutional: Negative for unexpected weight change.  HENT: Negative for rhinorrhea.   Respiratory: Negative for cough and shortness of breath.   Cardiovascular: Negative for chest pain.  Gastrointestinal: Negative for constipation and diarrhea.  Genitourinary: Negative for difficulty urinating.  Musculoskeletal: Negative for arthralgias and myalgias.  Skin: Negative for rash.  Allergic/Immunologic: Negative for environmental allergies.  Neurological: Negative for dizziness, numbness and headaches.  Psychiatric/Behavioral:       Denies concerns for anxiety or depression       Past Medical History:  Diagnosis Date  . Chicken pox   . Frequent headaches   . Migraine   . UTI (lower urinary tract infection)      Social History   Social History  . Marital status: Married    Spouse name: N/A  . Number of children: N/A  . Years of education: N/A   Occupational History  . Not on file.   Social History Main Topics  . Smoking status: Former Research scientist (life sciences)  . Smokeless tobacco: Not on file  . Alcohol use 0.6 oz/week    1 Glasses of wine  per week     Comment: socially  . Drug use: Unknown  . Sexual activity: Yes    Birth control/ protection: IUD   Other Topics Concern  . Not on file   Social History Narrative   Married.   4 children.   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   Enjoys reading.       Past Surgical History:  Procedure Laterality Date  . BREAST BIOPSY     pt states a small lump was removed when she was 60 years old, unsure of which side  . BREAST SURGERY  1973  . COLONOSCOPY WITH PROPOFOL N/A 12/12/2014   Procedure: COLONOSCOPY WITH PROPOFOL;  Surgeon: Manya Silvas, MD;  Location: [facility] ENDOSCOPY;  Service: Endoscopy;  Laterality: N/A;    Family History  Problem Relation Age of Onset  . Cancer Mother 54    breast  . Breast cancer Mother 32  . Cancer Sister 50    breast, deceased  . Parkinson's disease Father     deceased    No Known Allergies  No current outpatient prescriptions on file prior to visit.   No current facility-administered medications on file prior to visit.     BP (!) 138/92   Pulse 71   Temp 98.1 F (36.7 C) (Oral)   Ht 5\' 2"  (1.575 m)   Wt 245 lb 12.8 oz (111.5 kg)   SpO2 99%   BMI 44.96 kg/m    Objective:   Physical Exam  Constitutional: She is oriented to person, place, and time. She appears well-nourished.  HENT:  Right Ear: Tympanic membrane and ear canal normal.  Left Ear: Tympanic membrane and ear canal normal.  Nose: Nose normal.  Mouth/Throat: Oropharynx is clear and moist.  Eyes: Conjunctivae and EOM are normal. Pupils are equal, round, and reactive to light.  Neck: Neck supple. No thyromegaly present.  Cardiovascular: Normal rate and regular rhythm.   No murmur heard. Pulmonary/Chest: Effort normal and breath sounds normal. She has no rales.  Abdominal: Soft. Bowel sounds are normal. There is no tenderness.  Musculoskeletal: Normal range of motion.  Lymphadenopathy:    She has no cervical adenopathy.  Neurological: She is alert and  oriented to person, place, and time. She has normal reflexes. No cranial nerve deficit.  Skin: Skin is warm and dry. No rash noted.  Psychiatric: She has a normal mood and affect.          Assessment & Plan:

## 2015-10-14 ENCOUNTER — Telehealth: Payer: Self-pay | Admitting: *Deleted

## 2015-10-14 NOTE — Telephone Encounter (Signed)
PT brought in form to be filled out. Please call her when it is ready. [phone]. Form placed in prescription tower.

## 2015-10-14 NOTE — Telephone Encounter (Signed)
Placed form in Kate's inbox 

## 2015-10-14 NOTE — Telephone Encounter (Signed)
Noted. Placed in Chan's inbox for faxing.

## 2015-10-15 NOTE — Telephone Encounter (Signed)
Per DPR, left detail message for patient that form is ready for pick up.  Already faxed form to [place] at [phone]

## 2017-02-15 IMAGING — US US TRANSVAGINAL NON-OB
1 series · 13 of 25 positions shown · non-contrast
Comparison: None

CLINICAL DATA: 58-year-old postmenopausal female with a history of
IUD placement 7 years prior, status post unsuccessful attempted IUD
removal due to inability to palpate IUD string.

EXAM:
TRANSABDOMINAL AND TRANSVAGINAL ULTRASOUND OF PELVIS
TECHNIQUE: Both transabdominal and transvaginal ultrasound examinations of the
pelvis were performed. Transabdominal technique was performed for
global imaging of the pelvis including uterus, ovaries, adnexal
regions, and pelvic cul-de-sac. It was necessary to proceed with
endovaginal exam following the transabdominal exam to visualize the
endometrium and IUD.

[Series 1: us transvaginal non-ob · 0.25mm/px · 13 of 90 slices shown]
[im 1/90]
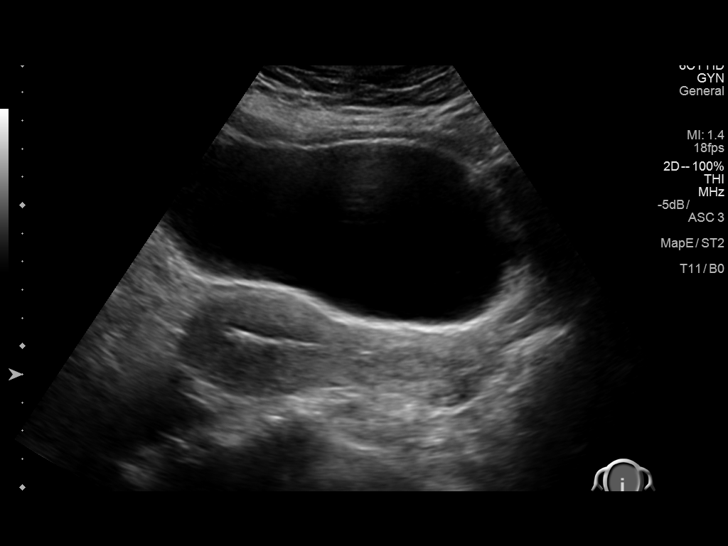
[im 8/90]
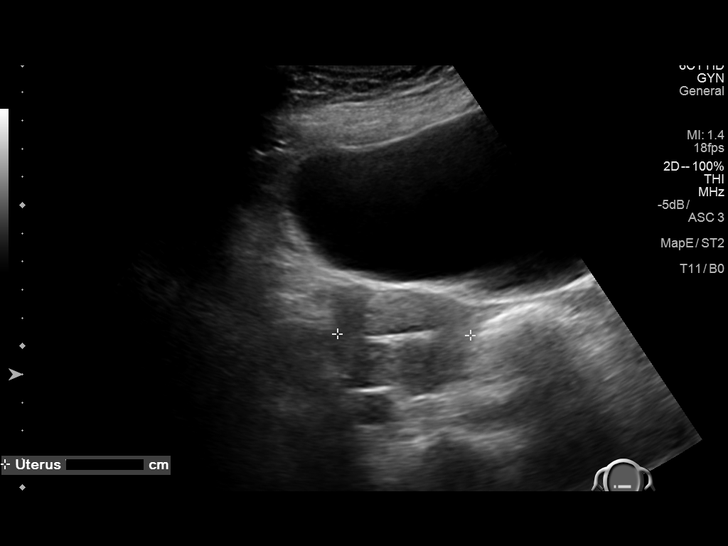
[im 15/90]
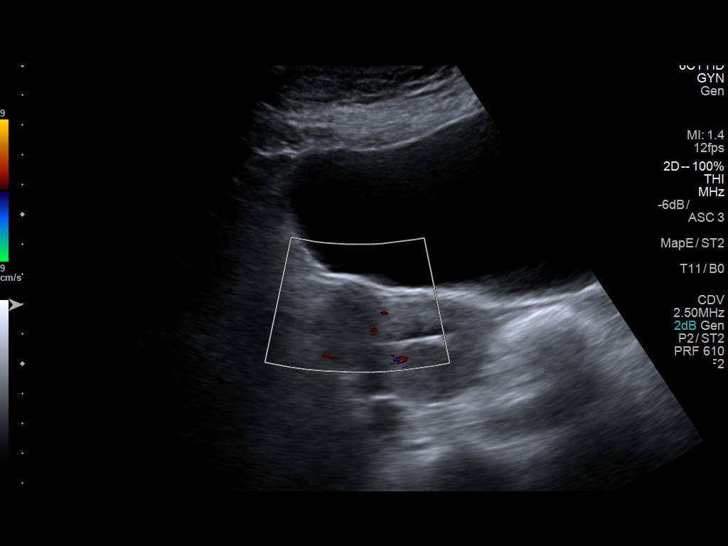
[im 23/90]
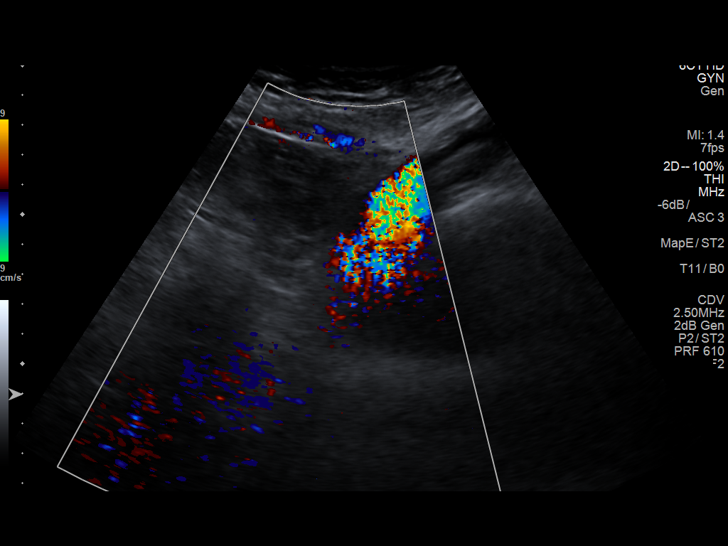
[im 30/90]
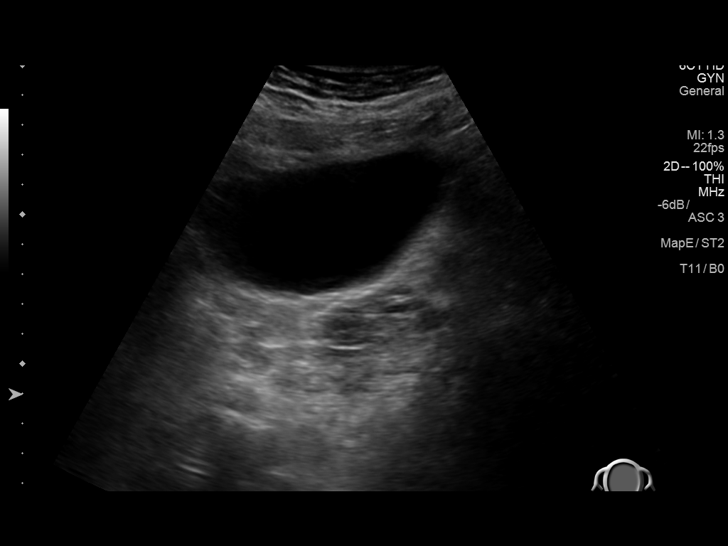
[im 38/90]
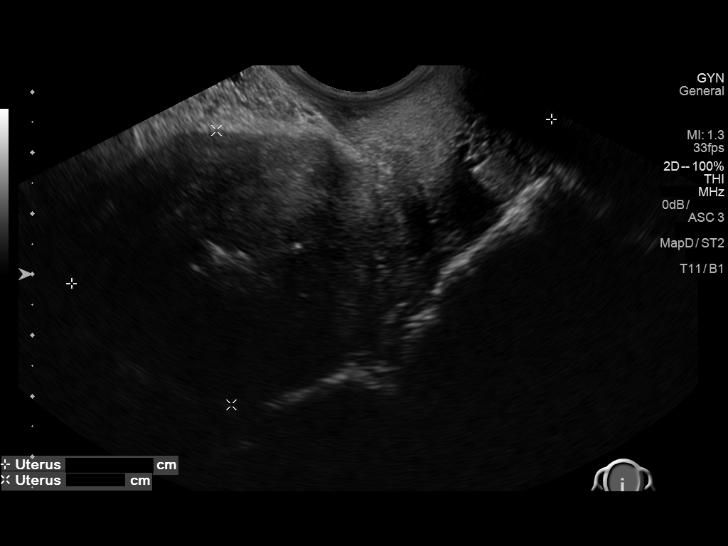
[im 45/90]
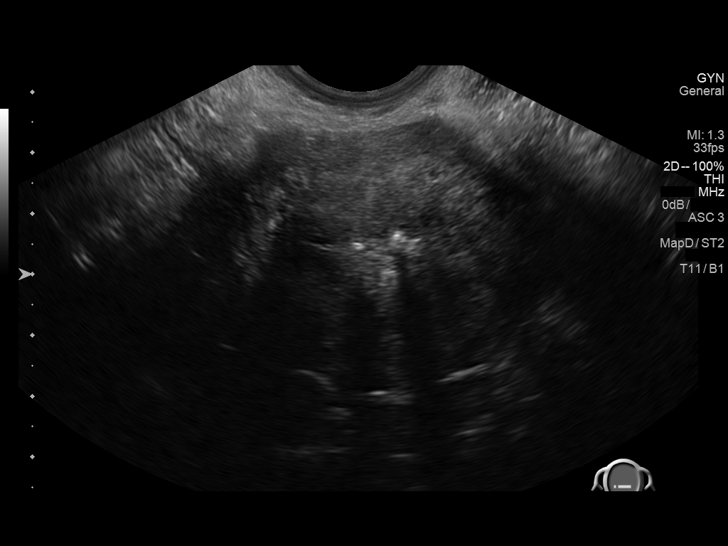
[im 52/90]
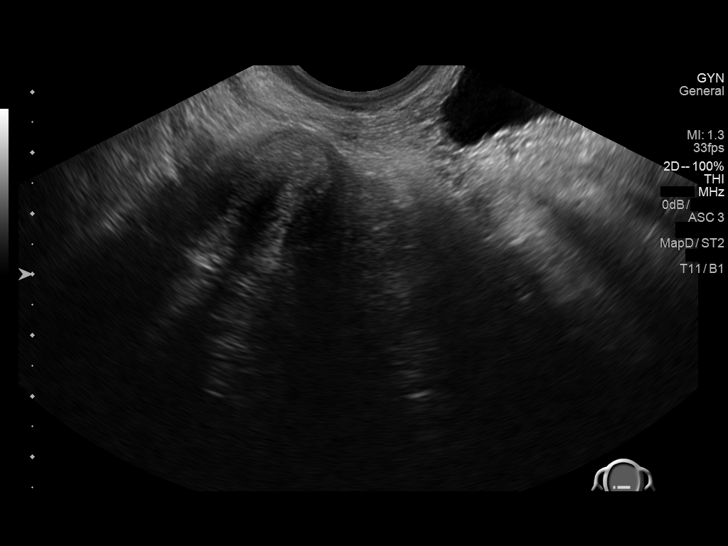
[im 60/90]
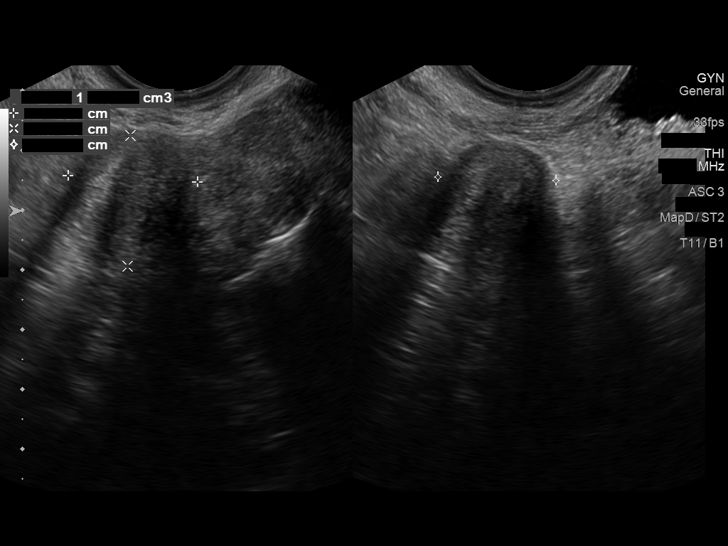
[im 67/90]
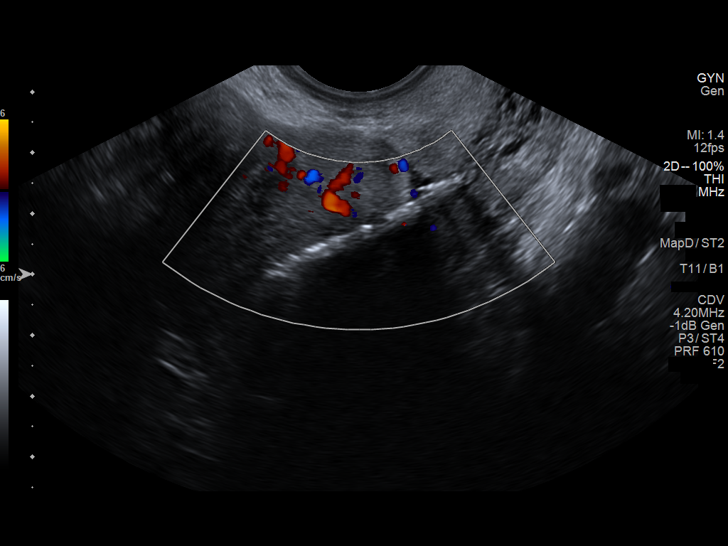
[im 75/90]
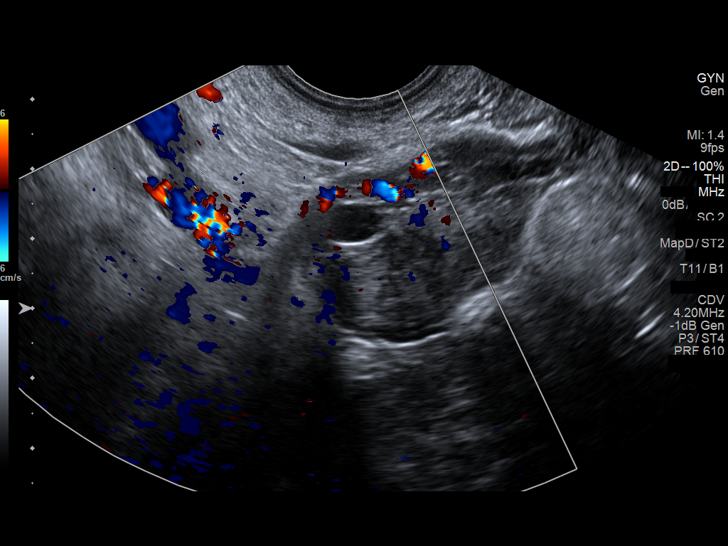
[im 82/90]
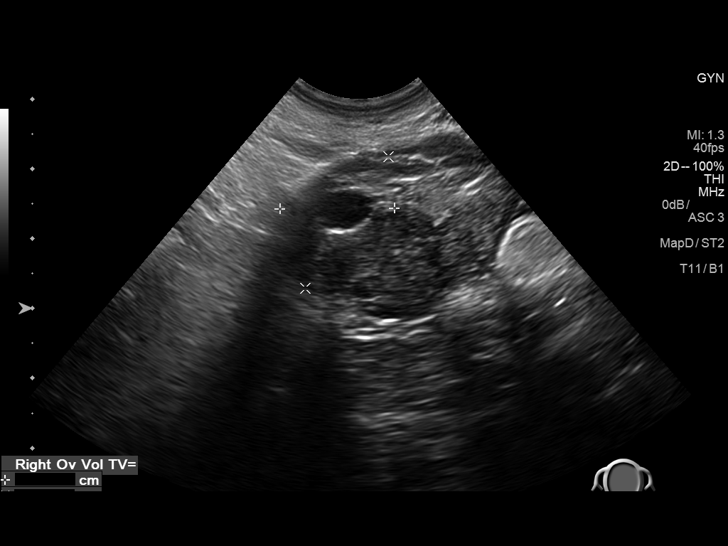
[im 90/90]
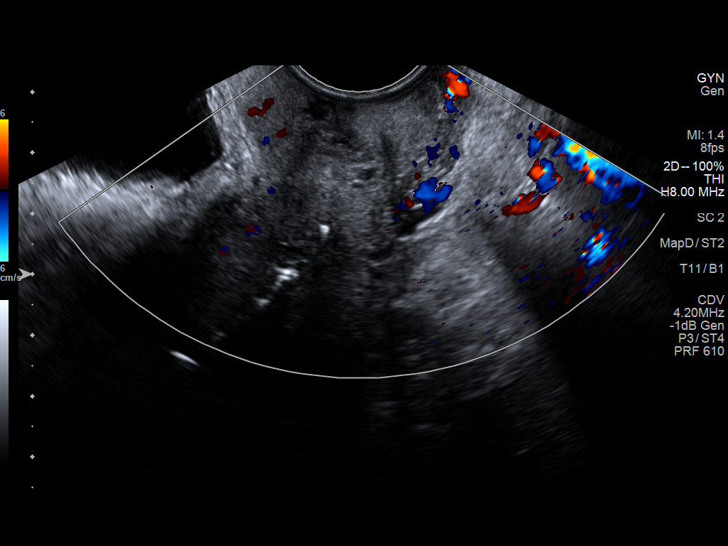

[13 of 25 positions shown; findings below may reference images not displayed]

FINDINGS: Uterus

Measurements: 10.8 x 3.8 x 4.7 cm. The uterus is anteverted and
anteflexed and mildly globular in confirmation. There is diffuse
myometrial heterogeneity with refractory myometrial shadowing,
suggesting uterine adenomyosis. There is a solitary right anterior
fundal subserosal 2.2 x 2.2 x 2.0 cm fibroid. Normal Cesarean scar
is seen in the anterior lower uterine segment.

Endometrium

Thickness: 3 mm. The intrauterine device appears well positioned
within the fundal and of the endometrial cavity, with no evidence of
myometrial penetration by the side arms of the device. Endometrial
visualization is limited by acoustic shadowing from the IUD. The
bilayer endometrial thickness is within normal postmenopausal
limits. No focal endometrial lesion or endometrial cavity fluid are
detected.

Right ovary

Measurements: 1.6 x 2.2 x 1.7 cm. There is a solitary
benign-appearing simple 0.7 cm right ovarian cyst. No suspicious
right ovarian or right adnexal masses.

Left ovary

Measurements: 3.1 x 1.3 x 3.4 cm. The left ovary is seen only on the
transabdominal portion of the scan and appears normal. No left
ovarian or left adnexal masses.

Other findings

No abnormal free fluid in the pelvis.
IMPRESSION: 1. IUD is well positioned within the endometrial cavity, with no IUD
complication detected.
2. Limited endometrial visualization, with no endometrial
abnormality detected.
3. Solitary subserosal uterine fibroid. Possible superimposed
uterine adenomyosis.
4. No suspicious ovarian or adnexal masses.
